# Patient Record
Sex: Male | Born: 1940 | Race: White | Hispanic: No
Health system: Southern US, Community
[De-identification: ages and names within clinical notes are randomized; demographics above are authoritative.]

## PROBLEM LIST (undated history)

## (undated) DIAGNOSIS — R1310 Dysphagia, unspecified: Secondary | ICD-10-CM

## (undated) DIAGNOSIS — I619 Nontraumatic intracerebral hemorrhage, unspecified: Secondary | ICD-10-CM

## (undated) DIAGNOSIS — I480 Paroxysmal atrial fibrillation: Secondary | ICD-10-CM

## (undated) DIAGNOSIS — F0390 Unspecified dementia without behavioral disturbance: Secondary | ICD-10-CM

## (undated) DIAGNOSIS — E441 Mild protein-calorie malnutrition: Secondary | ICD-10-CM

## (undated) DIAGNOSIS — I629 Nontraumatic intracranial hemorrhage, unspecified: Secondary | ICD-10-CM

## (undated) DIAGNOSIS — J189 Pneumonia, unspecified organism: Secondary | ICD-10-CM

## (undated) DIAGNOSIS — R569 Unspecified convulsions: Secondary | ICD-10-CM

---

## 2019-09-17 ENCOUNTER — Emergency Department (HOSPITAL_COMMUNITY)
Admission: EM | Admit: 2019-09-17 | Discharge: 2019-09-18 | Disposition: A | Discharge status: Skilled Nursing Facility | Payer: Medicare HMO | Attending: Emergency Medicine | Admitting: Emergency Medicine

## 2019-09-17 DIAGNOSIS — W06XXXA Fall from bed, initial encounter: Secondary | ICD-10-CM | POA: Diagnosis not present

## 2019-09-17 DIAGNOSIS — I4891 Unspecified atrial fibrillation: Secondary | ICD-10-CM | POA: Diagnosis not present

## 2019-09-17 DIAGNOSIS — S0181XA Laceration without foreign body of other part of head, initial encounter: Secondary | ICD-10-CM | POA: Diagnosis not present

## 2019-09-17 DIAGNOSIS — I611 Nontraumatic intracerebral hemorrhage in hemisphere, cortical: Secondary | ICD-10-CM | POA: Diagnosis not present

## 2019-09-17 DIAGNOSIS — Y999 Unspecified external cause status: Secondary | ICD-10-CM | POA: Diagnosis not present

## 2019-09-17 DIAGNOSIS — Y92129 Unspecified place in nursing home as the place of occurrence of the external cause: Secondary | ICD-10-CM | POA: Diagnosis not present

## 2019-09-17 DIAGNOSIS — F0390 Unspecified dementia without behavioral disturbance: Secondary | ICD-10-CM | POA: Insufficient documentation

## 2019-09-17 DIAGNOSIS — W19XXXA Unspecified fall, initial encounter: Secondary | ICD-10-CM

## 2019-09-17 DIAGNOSIS — Y9389 Activity, other specified: Secondary | ICD-10-CM | POA: Insufficient documentation

## 2019-09-17 HISTORY — DX: Unspecified dementia, unspecified severity, without behavioral disturbance, psychotic disturbance, mood disturbance, and anxiety: F03.90

## 2019-09-17 HISTORY — DX: Nontraumatic intracranial hemorrhage, unspecified: I62.9

## 2019-09-17 HISTORY — DX: Paroxysmal atrial fibrillation: I48.0

## 2019-09-17 HISTORY — DX: Nontraumatic intracerebral hemorrhage, unspecified: I61.9

## 2019-09-17 HISTORY — DX: Mild protein-calorie malnutrition: E44.1

## 2019-09-17 HISTORY — DX: Unspecified convulsions: R56.9

## 2019-09-17 HISTORY — DX: Dysphagia, unspecified: R13.10

## 2019-09-17 HISTORY — DX: Pneumonia, unspecified organism: J18.9

## 2019-09-17 NOTE — ED Triage Notes (Signed)
[facility] - per EMS pt was dx with UTI and pneumonia yesterday. Staff at nursing home states he fell today after tryin gto get up. Pt has a small laceration to left forehead. Pt takes ASA and eliquis.

## 2019-09-18 ENCOUNTER — Emergency Department (HOSPITAL_COMMUNITY): Payer: Medicare HMO

## 2019-09-18 ENCOUNTER — Encounter (HOSPITAL_COMMUNITY): Payer: Self-pay | Admitting: Emergency Medicine

## 2019-09-18 ENCOUNTER — Other Ambulatory Visit: Payer: Self-pay

## 2019-09-18 LAB — PROTIME-INR
INR: 1.2 (ref 0.8–1.2)
Prothrombin Time: 14.7 seconds (ref 11.4–15.2)

## 2019-09-18 LAB — BASIC METABOLIC PANEL
Anion gap: 8 (ref 5–15)
BUN: 30 mg/dL — ABNORMAL HIGH (ref 8–23)
CO2: 29 mmol/L (ref 22–32)
Calcium: 8.7 mg/dL — ABNORMAL LOW (ref 8.9–10.3)
Chloride: 103 mmol/L (ref 98–111)
Creatinine, Ser: 1.06 mg/dL (ref 0.61–1.24)
GFR calc Af Amer: >60 mL/min (ref >60)
GFR calc non Af Amer: >60 mL/min (ref >60)
Glucose, Bld: 98 mg/dL (ref 70–99)
Potassium: 3.8 mmol/L (ref 3.5–5.1)
Sodium: 140 mmol/L (ref 135–145)

## 2019-09-18 LAB — CBC WITH DIFFERENTIAL/PLATELET
Abs Immature Granulocytes: 0.1 10*3/uL — ABNORMAL HIGH (ref 0.00–0.07)
Basophils Absolute: 0.1 10*3/uL (ref 0.0–0.1)
Basophils Relative: 1 %
Eosinophils Absolute: 0.2 10*3/uL (ref 0.0–0.5)
Eosinophils Relative: 1 %
HCT: 40.9 % (ref 39.0–52.0)
Hemoglobin: 13 g/dL (ref 13.0–17.0)
Immature Granulocytes: 1 %
Lymphocytes Relative: 6 %
Lymphs Abs: 0.8 10*3/uL (ref 0.7–4.0)
MCH: 31 pg (ref 26.0–34.0)
MCHC: 31.8 g/dL (ref 30.0–36.0)
MCV: 97.4 fL (ref 80.0–100.0)
Monocytes Absolute: 0.9 10*3/uL (ref 0.1–1.0)
Monocytes Relative: 6 %
Neutro Abs: 12.5 10*3/uL — ABNORMAL HIGH (ref 1.7–7.7)
Neutrophils Relative %: 85 %
Platelets: 208 10*3/uL (ref 150–400)
RBC: 4.2 MIL/uL — ABNORMAL LOW (ref 4.22–5.81)
RDW: 15.2 % (ref 11.5–15.5)
WBC: 14.6 10*3/uL — ABNORMAL HIGH (ref 4.0–10.5)
nRBC: 0 % (ref 0.0–0.2)

## 2019-09-18 NOTE — ED Notes (Signed)
EKG done and seen by Dr Horton 

## 2019-09-18 NOTE — ED Provider Notes (Signed)
[facility] EMERGENCY DEPARTMENT Provider Note   CSN: [id number] Arrival date & time: 09/17/19  2354     History Chief Complaint  Patient presents with  . Fall    Caleb Logan is a 79 y.o. male.  HPI     This is a 79 year old male with a history of dementia, dysphagia, intracranial hemorrhage, atrial fibrillation on Eliquis, and seizures who presents with fall.  Per EMS, staff at nursing home states that he fell trying to get up.  They noted a small laceration to the left forehead.  Staff also reported that he was diagnosed with a UTI pneumonia yesterday.  Do not have any record of this.  Patient does not contribute to history taking.  He was reportedly hemodynamically stable in route and is at his baseline mental status.  Chart review shows that he is a DNR.  Also is on Levaquin currently.  Level 5 caveat for dementia  Past Medical History:  Diagnosis Date  . Dementia without behavioral disturbance ([place])   . Dysphagia   . Mild protein-calorie malnutrition ([place])   . 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 Paroxysmal atrial fibrillation ([facility])   . Pneumonia   . Seizures ([place])     There are no problems to display for this patient.   History reviewed. No pertinent surgical history.     No family history on file.  Social History   Tobacco Use  . Smoking status: Unknown If Ever Smoked  Substance Use Topics  . Alcohol use: Not on file  . Drug use: Not on file    Home Medications Prior to Admission medications   Not on File    Allergies    Patient has no allergy information on record.  Review of Systems   Review of Systems  Unable to perform ROS: Dementia    Physical Exam Updated Vital Signs BP (!) 156/67   Pulse 95   Resp (!) 23   Ht 1.727 m (5\' 8" )   Wt 68 kg   SpO2 97%   BMI 22.81 kg/m   Physical Exam Vitals and nursing note reviewed.  Constitutional:      Appearance: He is  well-developed.     Comments: Chronically ill-appearing, elderly, somnolent but arousable  HENT:     Head: Normocephalic.     Comments: 2 cm laceration just superior and lateral to the left eyebrow, slight oozing noted    Right Ear: Tympanic membrane normal.     Left Ear: Tympanic membrane normal.     Mouth/Throat:     Mouth: Mucous membranes are dry.  Eyes:     Pupils: Pupils are equal, round, and reactive to light.  Cardiovascular:     Rate and Rhythm: Normal rate and regular rhythm.     Heart sounds: Normal heart sounds.  Pulmonary:     Effort: Pulmonary effort is normal. No respiratory distress.     Breath sounds: Normal breath sounds.  Abdominal:     General: Bowel sounds are normal.     Palpations: Abdomen is soft.     Tenderness: There is no abdominal tenderness. There is no rebound.  Musculoskeletal:     Cervical back: Neck supple.     Comments: Normal range of motion of bilateral hips and knees, no obvious deformities  Skin:    General: Skin is warm and dry.  Neurological:     Comments: Somnolent but arousable, very difficult to understand, appears to  move all 4 extremities spontaneously  Psychiatric:     Comments: Unable to assess     ED Results / Procedures / Treatments   Labs (all labs ordered are listed, but only abnormal results are displayed) Labs Reviewed  CBC WITH DIFFERENTIAL/PLATELET - Abnormal; Notable for the following components:      Result Value   WBC 14.6 (*)    RBC 4.20 (*)    Neutro Abs 12.5 (*)    Abs Immature Granulocytes 0.10 (*)    All other components within normal limits  BASIC METABOLIC PANEL - Abnormal; Notable for the following components:   BUN 30 (*)    Calcium 8.7 (*)    All other components within normal limits  PROTIME-INR    EKG EKG Interpretation  Date/Time:  Wednesday September 18 2019 00:04:13 EST Ventricular Rate:  72 PR Interval:    QRS Duration: 98 QT Interval:  452 QTC Calculation: 495 R Axis:   78 Text  Interpretation: Sinus rhythm Consider left atrial enlargement Left ventricular hypertrophy Anterior infarct, old Nonspecific T abnormalities, lateral leads Baseline wander in lead(s) V4 No prior Confirmed by Ross Marcus ([id number]) on 09/18/2019 12:40:46 AM   Radiology CT Head Wo Contrast  Result Date: 09/18/2019 CLINICAL DATA:  Larey Seat, left frontal scalp laceration EXAM: CT HEAD WITHOUT CONTRAST TECHNIQUE: Contiguous axial images were obtained from the base of the skull through the vertex without intravenous contrast. COMPARISON:  None. FINDINGS: Brain: In the right frontal lobe there is a 5.6 x 3.6 by 4.4 cm hyperdense region with surrounding vasogenic edema. Mass effect upon the frontal horn of the right lateral ventricle, with minimal leftward subfalcine herniation. Findings are compatible with acute to subacute intraparenchymal hemorrhage. Low-attenuation left-sided subdural fluid collection measuring up to 9 mm consistent with chronic subdural hematoma. No evidence of acute infarct. 5 mm colloid cyst. Remaining midline structures are unremarkable. Vascular: No hyperdense vessel or unexpected calcification. Skull: Normal. Negative for fracture or focal lesion. Sinuses/Orbits: No acute finding. Other: None IMPRESSION: 1. Large hyperdense region in the right frontal lobe consistent with acute to subacute intraparenchymal hemorrhage. Mild mass effect. 2. 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 3. No acute infarct. 4. 5 mm colloid cyst. These results were called by telephone at the time of interpretation on 09/18/2019 at 1:08 am to provider [facility] , who verbally acknowledged these results. Electronically Signed   By: Sharlet Salina M.D.   On: 09/18/2019 01:08   CT Cervical Spine Wo Contrast  Result Date: 09/18/2019 CLINICAL DATA:  Larey Seat, left frontal scalp laceration EXAM: CT CERVICAL SPINE WITHOUT CONTRAST TECHNIQUE: Multidetector CT imaging of the cervical spine was  performed without intravenous contrast. Multiplanar CT image reconstructions were also generated. COMPARISON:  None. FINDINGS: Alignment: Alignment is anatomic. Skull base and vertebrae: No acute displaced fractures. Soft tissues and spinal canal: No prevertebral fluid or swelling. No visible canal hematoma. Disc levels: There is mild spondylosis at C2-3, C3-4, and C4-5. Moderate spondylosis is seen at C6-7. No significant central canal or neural foraminal encroachment. Upper chest: Airway is patent.  Lung apices are clear Other: Reconstructed images demonstrate no additional findings. IMPRESSION: 1. Multilevel cervical spondylosis.  No acute fracture. Electronically Signed   By: Sharlet Salina M.D.   On: 09/18/2019 01:01    Procedures .Marland KitchenLaceration Repair  Date/Time: 09/18/2019 3:59 AM Performed by: Shon Baton, MD Authorized by: Shon Baton, MD   Consent:    Consent obtained:  Emergent situation  Anesthesia (see MAR for exact dosages):    Anesthesia method:  None Laceration details:    Location:  Face   Face location:  Forehead   Length (cm):  2   Depth (mm):  3 Repair type:    Repair type:  Simple Exploration:    Hemostasis achieved with:  Direct pressure   Wound extent: no areolar tissue violation noted, no fascia violation noted, no foreign bodies/material noted, no muscle damage noted, no nerve damage noted, no tendon damage noted, no underlying fracture noted and no vascular damage noted     Contaminated: no   Treatment:    Area cleansed with:  Saline   Amount of cleaning:  Standard   Irrigation solution:  Sterile saline   Visualized foreign bodies/material removed: no   Skin repair:    Repair method:  Steri-Strips and tissue adhesive   Number of Steri-Strips:  4 Approximation:    Approximation:  Close Post-procedure details:    Dressing:  Bulky dressing   Patient tolerance of procedure:  Tolerated well, no immediate complications   (including critical care  time)  Medications Ordered in ED Medications - No data to display  ED Course  I have reviewed the triage vital signs and the nursing notes.  Pertinent labs & imaging results that were available during my care of the patient were reviewed by me and considered in my medical decision making (see chart for details).  Clinical Course as of Sep 17 356  Wed Sep 18, 2019  0100 Spoke with the patient's nurse at the [facility].  He does have a DNR filed but they did not send the yellow sheet.  Unable to get up with his daughter at this time to confirm.  He has not started his Eliquis back.  She reports that he was agitated and fell out of bed.   [CH]  0117 Received a call from radiology.  Patient with a likely subacute intraparenchymal bleed.  Could be consistent with the bleed he was admitted to the [facility] for.  Unfortunately, do not have comparison imaging.   [CH]  0355 Records reviewed from [facility].  Patient admitted on 2/3 with an intracranial bleed recorded at 5.5 by 3 cm right frontal lobe injury cranial bleed.  This is consistent with the imaging that appears obtained today.   [CH]    Clinical Course User Index [CH] Jamian Andujo, Mayer Masker, MD   MDM Rules/Calculators/A&P                      Patient presents following a reported fall.  I have spoken to the nursing.  They report that he was at his baseline otherwise.  Unfortunately initially did not have any imaging to compare CT imaging with here.  He does have a large right frontal intracranial bleed.  He has not reinitiated his Eliquis yet.  Laceration was repaired with Steri-Strips and Dermabond.  See clinical course above.  Records from [facility] indicate a similar sized intraparenchymal bleed so I suspect this is what we are seeing on her CT scan.  Because of this, will discharge patient back to his facility.  After history, exam, and medical workup I feel the patient has been appropriately medically screened and is safe for discharge home.  Pertinent diagnoses were discussed with the patient. Patient was given return precautions.   Final Clinical Impression(s) / ED Diagnoses Final diagnoses:  Fall, initial encounter  Facial laceration, initial encounter    Rx /  DC Orders ED Discharge Orders    None       Jceon Alverio, Mayer Masker, MD 09/18/19 0400

## 2019-09-18 NOTE — ED Notes (Signed)
Pt's O2 85% on RA, pt placed on 2LPM via [place] O2 % increased to 90%.

## 2019-09-18 NOTE — Discharge Instructions (Addendum)
Laceration was repaired with Steri-Strips and Dermabond.  Allow to fall off on its own.

## 2019-09-18 NOTE — ED Notes (Signed)
Mittens placed on pt for pt safety. Pressure dressing applied to head.

## 2019-11-23 DEATH — deceased

## 2021-08-05 IMAGING — CT CT CERVICAL SPINE W/O CM
3 series · 14 of 35 positions shown, 17 images · non-contrast
Comparison: None.

CLINICAL DATA: Fell, left frontal scalp laceration

EXAM:
CT CERVICAL SPINE WITHOUT CONTRAST
TECHNIQUE: Multidetector CT imaging of the cervical spine was performed without
intravenous contrast. Multiplanar CT image reconstructions were also
generated.

[Series 4: c spine soft · axial · 0.36mm/px · z∈[+1570,+1708]mm · 6 of 85 slices shown, 8 images]
[im 13/85  soft-tissue]
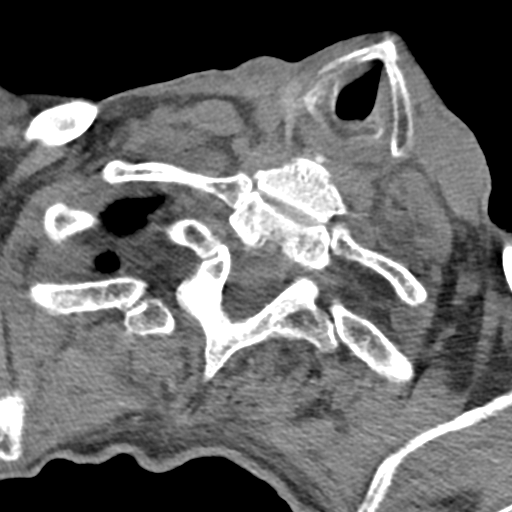
[im 13/85  bone]
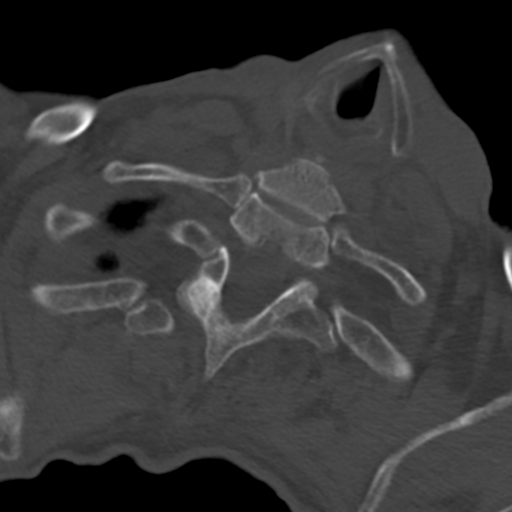
[im 26/85  bone]
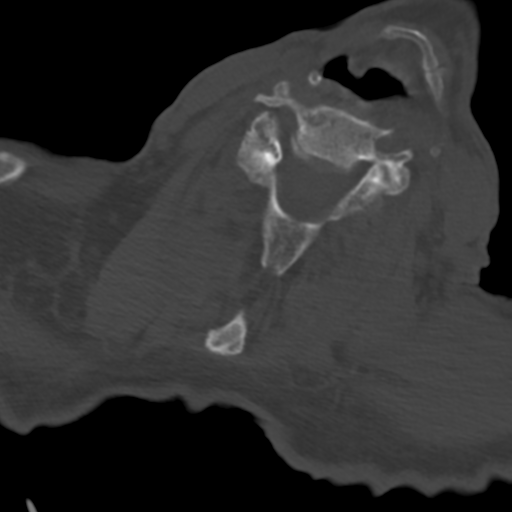
[im 39/85  bone]
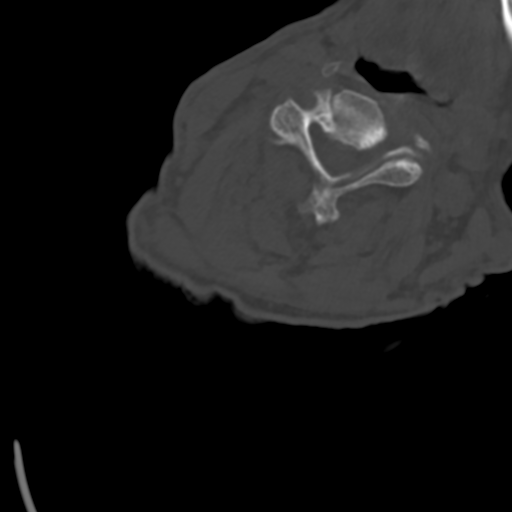
[im 52/85  bone]
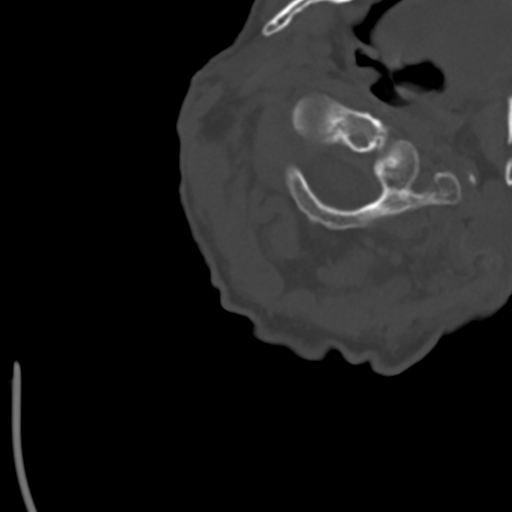
[im 65/85  soft-tissue]
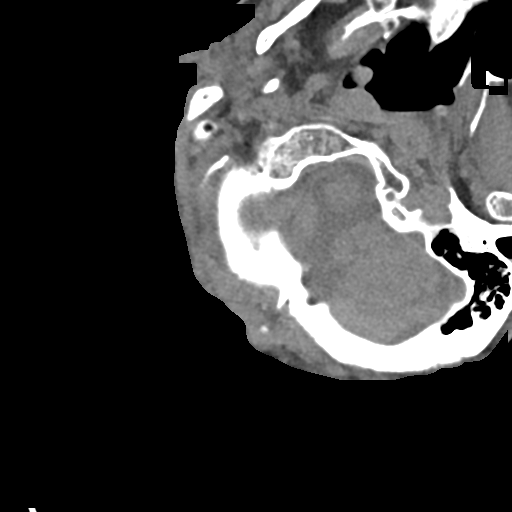
[im 65/85  bone]
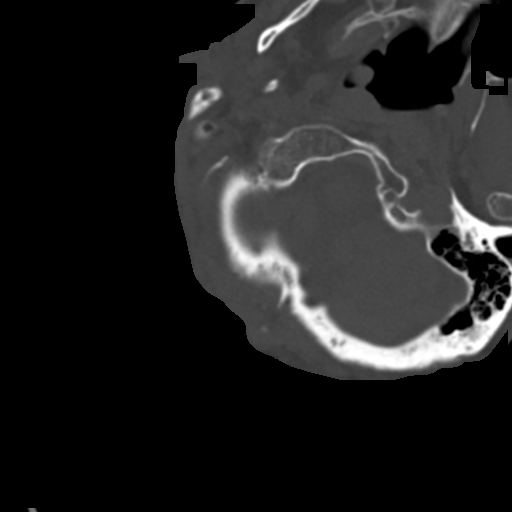
[im 78/85  bone]
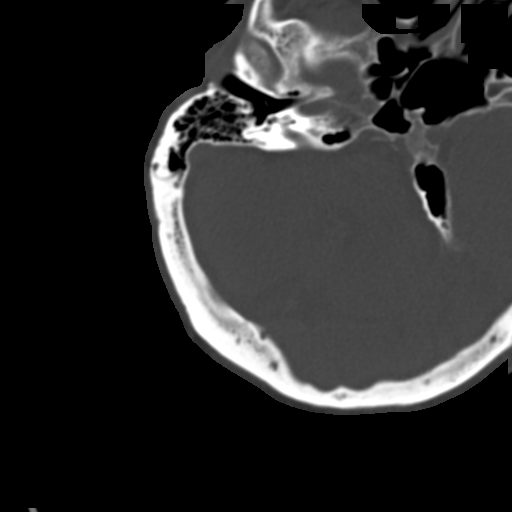

[Series 5: sagittal bone · sagittal · 0.26mm/px · 5 of 61 slices shown, 6 images]
[im 21/61  bone]
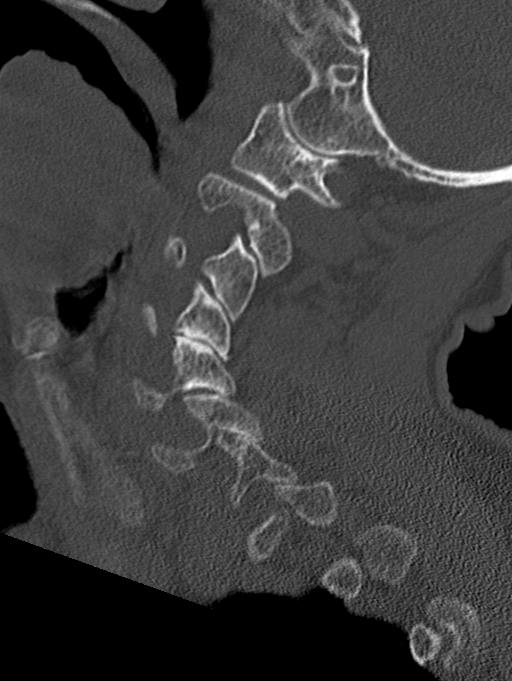
[im 26/61  bone]
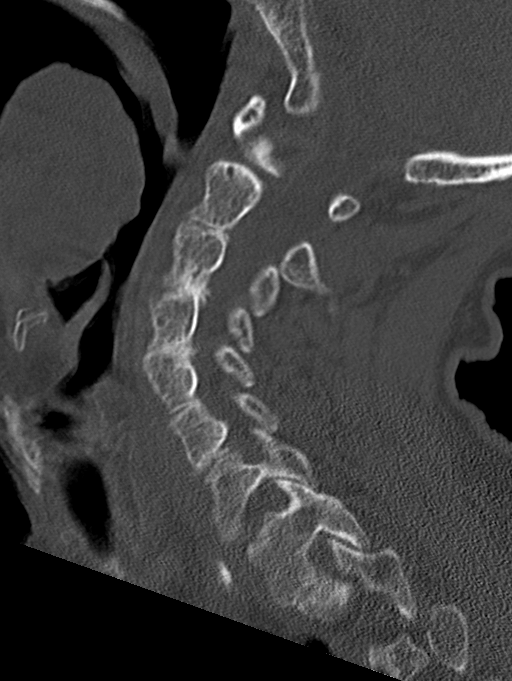
[im 31/61  soft-tissue]
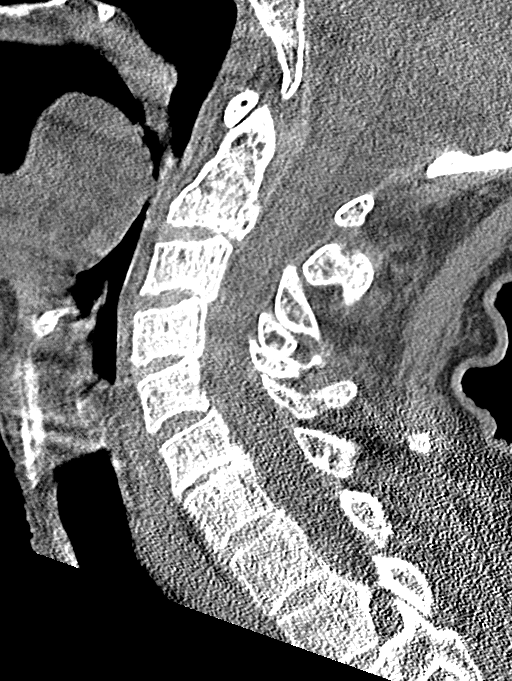
[im 31/61  bone]
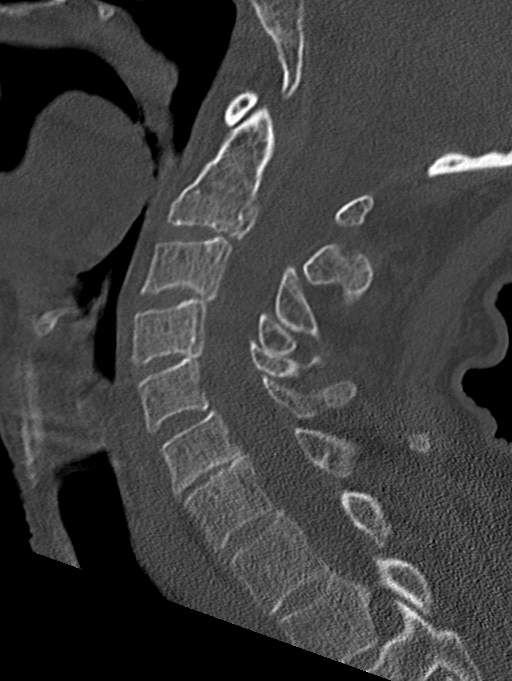
[im 36/61  bone]
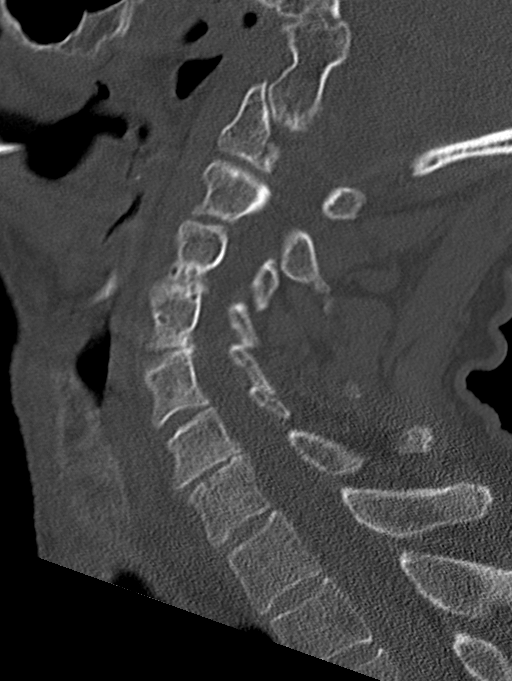
[im 41/61  bone]
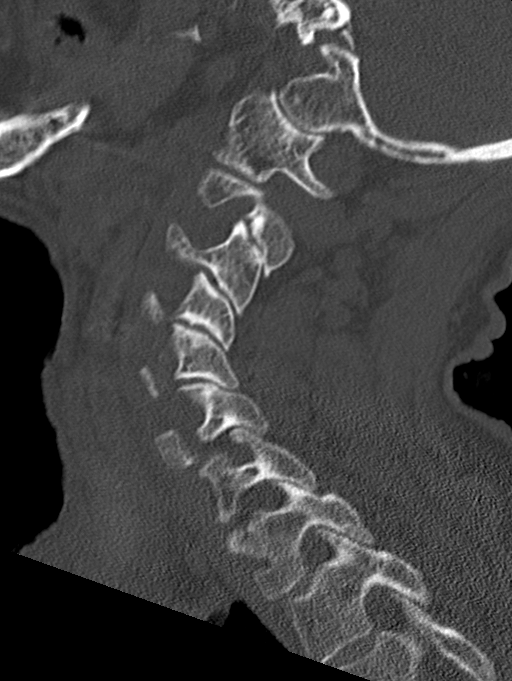

[Series 6: coronal bone · coronal · 0.26mm/px · 3 of 61 slices shown]
[im 13/61  bone]
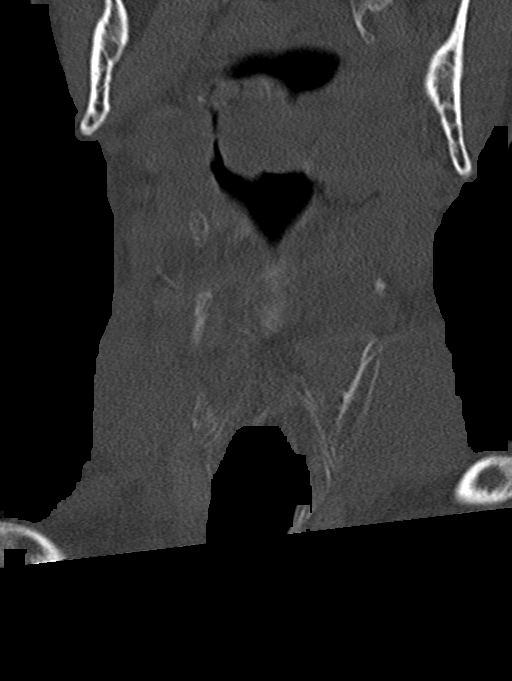
[im 25/61  bone]
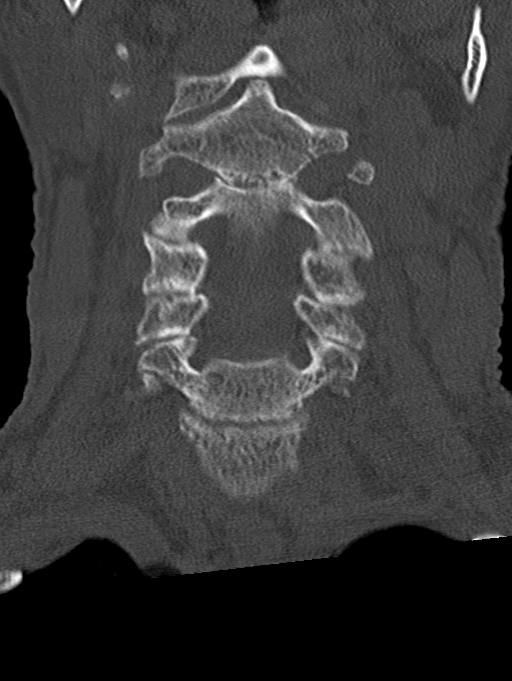
[im 37/61  bone]
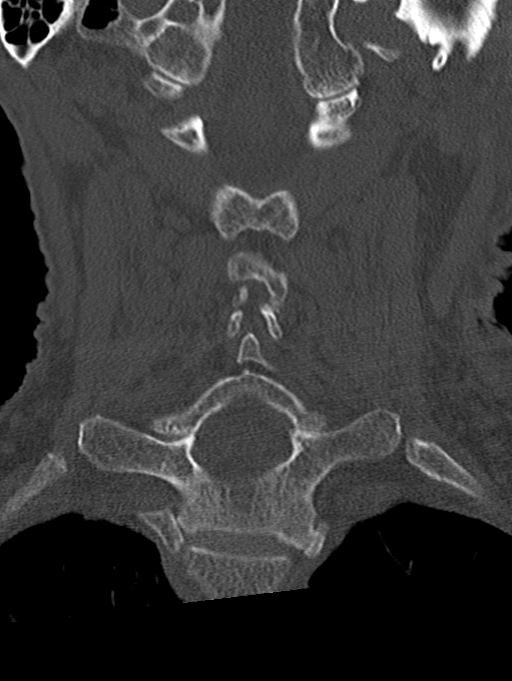

[14 of 35 positions shown; findings below may reference images not displayed]

FINDINGS: Alignment: Alignment is anatomic.

Skull base and vertebrae: No acute displaced fractures.

Soft tissues and spinal canal: No prevertebral fluid or swelling. No
visible canal hematoma.

Disc levels: There is mild spondylosis at C2-3, C3-4, and C4-5.
Moderate spondylosis is seen at C6-7. No significant central canal
or neural foraminal encroachment.

Upper chest: Airway is patent.  Lung apices are clear

Other: Reconstructed images demonstrate no additional findings.
IMPRESSION: 1. Multilevel cervical spondylosis.  No acute fracture.
